# Patient Record
Sex: Female | Born: 1950 | Race: White | Hispanic: No | Smoking: Current some day smoker
Health system: Southern US, Community
[De-identification: ages and names within clinical notes are randomized; demographics above are authoritative.]

## PROBLEM LIST (undated history)

## (undated) DIAGNOSIS — F419 Anxiety disorder, unspecified: Secondary | ICD-10-CM

## (undated) DIAGNOSIS — F909 Attention-deficit hyperactivity disorder, unspecified type: Secondary | ICD-10-CM

## (undated) DIAGNOSIS — F32A Depression, unspecified: Secondary | ICD-10-CM

## (undated) DIAGNOSIS — E079 Disorder of thyroid, unspecified: Secondary | ICD-10-CM

## (undated) DIAGNOSIS — F329 Major depressive disorder, single episode, unspecified: Secondary | ICD-10-CM

## (undated) HISTORY — DX: Depression, unspecified: F32.A

## (undated) HISTORY — DX: Attention-deficit hyperactivity disorder, unspecified type: F90.9

## (undated) HISTORY — DX: Major depressive disorder, single episode, unspecified: F32.9

## (undated) HISTORY — DX: Anxiety disorder, unspecified: F41.9

## (undated) HISTORY — PX: THYROIDECTOMY: SHX17

## (undated) HISTORY — DX: Disorder of thyroid, unspecified: E07.9

## (undated) HISTORY — PX: ABDOMINAL HYSTERECTOMY: SHX81

## (undated) HISTORY — PX: COLON SURGERY: SHX602

---

## 2011-05-30 ENCOUNTER — Ambulatory Visit: Payer: Self-pay | Admitting: Internal Medicine

## 2012-05-30 ENCOUNTER — Ambulatory Visit: Payer: Self-pay | Admitting: Internal Medicine

## 2012-11-08 ENCOUNTER — Ambulatory Visit: Payer: Self-pay | Admitting: Unknown Physician Specialty

## 2012-11-20 ENCOUNTER — Emergency Department: Payer: Self-pay | Admitting: Emergency Medicine

## 2012-11-20 LAB — URINALYSIS, COMPLETE
Bilirubin,UR: NEGATIVE
Leukocyte Esterase: NEGATIVE
Nitrite: NEGATIVE
Ph: 7 (ref 4.5–8.0)
Protein: NEGATIVE
RBC,UR: <1 /HPF (ref 0–5)
Specific Gravity: 1.006 (ref 1.003–1.030)

## 2012-11-20 LAB — COMPREHENSIVE METABOLIC PANEL
Albumin: 4 g/dL (ref 3.4–5.0)
Alkaline Phosphatase: 145 U/L — ABNORMAL HIGH (ref 50–136)
Anion Gap: 6 — ABNORMAL LOW (ref 7–16)
Bilirubin,Total: 0.3 mg/dL (ref 0.2–1.0)
Co2: 30 mmol/L (ref 21–32)
Creatinine: 0.8 mg/dL (ref 0.60–1.30)
EGFR (African American): >60
Glucose: 100 mg/dL — ABNORMAL HIGH (ref 65–99)
Potassium: 3.6 mmol/L (ref 3.5–5.1)
Sodium: 137 mmol/L (ref 136–145)

## 2012-11-20 LAB — CBC
HCT: 39.2 % (ref 35.0–47.0)
MCH: 32.6 pg (ref 26.0–34.0)
MCV: 94 fL (ref 80–100)
RBC: 4.16 10*6/uL (ref 3.80–5.20)
RDW: 13.6 % (ref 11.5–14.5)

## 2012-11-20 LAB — TROPONIN I: Troponin-I: <0.02 ng/mL

## 2013-07-16 ENCOUNTER — Ambulatory Visit: Payer: Self-pay | Admitting: Internal Medicine

## 2014-07-21 ENCOUNTER — Ambulatory Visit: Payer: Self-pay | Admitting: Internal Medicine

## 2014-10-12 ENCOUNTER — Other Ambulatory Visit: Payer: Self-pay | Admitting: Unknown Physician Specialty

## 2014-10-12 DIAGNOSIS — M1711 Unilateral primary osteoarthritis, right knee: Secondary | ICD-10-CM

## 2014-10-15 ENCOUNTER — Ambulatory Visit
Admission: RE | Admit: 2014-10-15 | Discharge: 2014-10-15 | Disposition: A | Discharge status: Home / Self Care | Payer: PPO | Source: Ambulatory Visit | Attending: Unknown Physician Specialty | Admitting: Unknown Physician Specialty

## 2014-10-15 DIAGNOSIS — M948X6 Other specified disorders of cartilage, lower leg: Secondary | ICD-10-CM | POA: Insufficient documentation

## 2014-10-15 DIAGNOSIS — M1711 Unilateral primary osteoarthritis, right knee: Secondary | ICD-10-CM | POA: Insufficient documentation

## 2014-10-15 DIAGNOSIS — M94261 Chondromalacia, right knee: Secondary | ICD-10-CM | POA: Diagnosis not present

## 2014-10-15 DIAGNOSIS — S83241A Other tear of medial meniscus, current injury, right knee, initial encounter: Secondary | ICD-10-CM | POA: Diagnosis not present

## 2014-10-22 ENCOUNTER — Other Ambulatory Visit: Payer: Self-pay | Admitting: Unknown Physician Specialty

## 2014-10-22 DIAGNOSIS — M1711 Unilateral primary osteoarthritis, right knee: Secondary | ICD-10-CM

## 2014-10-24 ENCOUNTER — Other Ambulatory Visit: Payer: Self-pay | Admitting: Psychiatry

## 2014-10-29 ENCOUNTER — Ambulatory Visit: Payer: PPO

## 2014-11-02 ENCOUNTER — Ambulatory Visit: Payer: PPO | Admitting: Psychiatry

## 2014-11-03 ENCOUNTER — Other Ambulatory Visit: Payer: Self-pay | Admitting: Unknown Physician Specialty

## 2014-11-03 DIAGNOSIS — M1711 Unilateral primary osteoarthritis, right knee: Secondary | ICD-10-CM

## 2014-11-05 ENCOUNTER — Other Ambulatory Visit: Payer: Self-pay | Admitting: Psychiatry

## 2014-11-05 ENCOUNTER — Ambulatory Visit
Admission: RE | Admit: 2014-11-05 | Discharge: 2014-11-05 | Disposition: A | Discharge status: Home / Self Care | Payer: PPO | Source: Ambulatory Visit | Attending: Unknown Physician Specialty | Admitting: Unknown Physician Specialty

## 2014-11-05 DIAGNOSIS — M1711 Unilateral primary osteoarthritis, right knee: Secondary | ICD-10-CM

## 2014-12-10 ENCOUNTER — Ambulatory Visit: Payer: Self-pay | Admitting: Psychiatry

## 2014-12-24 ENCOUNTER — Ambulatory Visit (INDEPENDENT_AMBULATORY_CARE_PROVIDER_SITE_OTHER): Payer: PPO | Admitting: Psychiatry

## 2014-12-24 ENCOUNTER — Encounter: Payer: Self-pay | Admitting: Psychiatry

## 2014-12-24 VITALS — BP 110/78 | HR 73 | Temp 97.9°F | Ht 59.0 in | Wt 106.6 lb

## 2014-12-24 DIAGNOSIS — F331 Major depressive disorder, recurrent, moderate: Secondary | ICD-10-CM

## 2014-12-24 MED ORDER — TRAZODONE HCL 100 MG PO TABS: 100.0000 mg | ORAL_TABLET | Freq: Every day | ORAL | Status: DC

## 2014-12-24 MED ORDER — SERTRALINE HCL 100 MG PO TABS: 100.0000 mg | ORAL_TABLET | Freq: Every day | ORAL | Status: DC

## 2014-12-24 NOTE — Progress Notes (Signed)
BH MD/PA/NP OP Progress Note  12/24/2014 11:41 AM Brooke Li  MRN:  [id number]  Subjective:    Patient is a 64 year old female who presented for follow-up appointment. She was last seen in April. She reported that she has the partial knee replacement done 2 months ago as she was having severe pain in her knees. She is recuperating from the same. She reported that she has not been back to her work. She was concerned about her knee pain and reported that she feels anxious and depressed due to the same. Patient reported that she has been trying to exercise and to do self-help techniques to control her depression and anxiety. Patient reported that she is minimizing the use of pain medications. She was also having some issues with her stepsister. Patient reported that she is compliant with her medications and has been taking Zoloft and trazodone as prescribed. She reported that she has several medical bills related to her surgery.     Chief Complaint:  Chief Complaint    Follow-up; Anxiety; Depression; Stress     Visit Diagnosis:     ICD-9-CM ICD-10-CM   1. Major depressive disorder, recurrent episode, moderate 296.32 F33.1     Past Medical History:  Past Medical History  Diagnosis Date  . ADHD (attention deficit hyperactivity disorder)   . Anxiety   . Depression   . Thyroid disease     Past Surgical History  Procedure Laterality Date  . Thyroidectomy    . Abdominal hysterectomy    . Colon surgery     Family History:  Family History  Problem Relation Age of Onset  . Depression Mother   . Alcohol abuse Mother   . Heart attack Father   . COPD Sister   . Cancer Sister    Social History:  Social History   Social History  . Marital Status: Widowed    Spouse Name: N/A  . Number of Children: N/A  . Years of Education: N/A   Social History Main Topics  . Smoking status: Current Some Day Smoker -- 0.25 packs/day    Types: Cigarettes    Start date: 12/23/1969  . Smokeless  tobacco: Never Used  . Alcohol Use: No  . Drug Use: Yes    Special: Marijuana     Comment: last used 2-3 weeks ago  . Sexual Activity: No   Other Topics Concern  . None   Social History Narrative  . None   Additional History:   Patient currently lives by herself with her cat.  Assessment:   Musculoskeletal: Strength & Muscle Tone: within normal limits Gait & Station: just has partial knee replacement Patient leans: N/A  Psychiatric Specialty Exam: HPI  ROS  Blood pressure 110/78, pulse 73, temperature 97.9 F (36.6 C), temperature source Tympanic, height  (1.499 m), weight 106 lb 9.6 oz (48.353 kg), SpO2 95 %.Body mass index is 21.52 kg/(m^2).  General Appearance: Casual  Eye Contact:  Fair  Speech:  Clear and Coherent  Volume:  Normal  Mood:  Euphoric  Affect:  Congruent  Thought Process:  Coherent and Goal Directed  Orientation:  Full (Time, Place, and Person)  Thought Content:  WDL  Suicidal Thoughts:  No  Homicidal Thoughts:  No  Memory:  Immediate;   Fair  Judgement:  Fair  Insight:  Fair  Psychomotor Activity:  Normal  Concentration:  Fair  Recall:  [employer] of Knowledge: Fair  Language: Fair  Akathisia:  No  Handed:  Right  AIMS (if indicated):    Assets:  Communication Skills Desire for Improvement Physical Health  ADL's:  Intact  Cognition: WNL  Sleep:  6-7   Is the patient at risk to self?  No. Has the patient been a risk to self in the past 6 months?  No. Has the patient been a risk to self within the distant past?  Yes.   Is the patient a risk to others?  No. Has the patient been a risk to others in the past 6 months?  No. Has the patient been a risk to others within the distant past?  No.  Current Medications: Current Outpatient Prescriptions  Medication Sig Dispense Refill  . levothyroxine (SYNTHROID, LEVOTHROID) 88 MCG tablet TK 1 T PO ONCE D. TAKE OES WITH A GLASS OF WATER AT LEAST 30 TO 60 MIN B BRE  3  . pantoprazole  (PROTONIX) 20 MG tablet TK 1 T PO ONCE D  3  . sertraline (ZOLOFT) 100 MG tablet TAKE 1 TABLET BY MOUTH DAILY 30 tablet 0  . simvastatin (ZOCOR) 20 MG tablet TK 1 T PO ONCE D  3  . traZODone (DESYREL) 100 MG tablet Take 1 tablet (100 mg total) by mouth at bedtime. 30 tablet 0   No current facility-administered medications for this visit.    Medical Decision Making:  Established Problem, Stable/Improving (1), Review of Psycho-Social Stressors (1) and Review of Last Therapy Session (1)  Treatment Plan Summary:Medication management   She will continue on Zoloft and trazodone as prescribed. Patient was given a 90 day supply of her medications.  Follow-up in 3 months.   Brandy Hale 12/24/2014, 11:41 AM

## 2015-03-24 NOTE — Progress Notes (Signed)
As of 12-24-14 pt still taking both medications.  This was a reorder

## 2015-03-25 ENCOUNTER — Ambulatory Visit: Payer: PPO | Admitting: Psychiatry

## 2015-03-30 ENCOUNTER — Ambulatory Visit: Payer: PPO | Admitting: Psychiatry

## 2015-06-22 ENCOUNTER — Ambulatory Visit: Payer: PPO | Admitting: Psychiatry

## 2015-06-24 ENCOUNTER — Other Ambulatory Visit: Payer: Self-pay | Admitting: Psychiatry

## 2015-09-20 ENCOUNTER — Other Ambulatory Visit: Payer: Self-pay | Admitting: Psychiatry

## 2015-09-27 ENCOUNTER — Other Ambulatory Visit: Payer: Self-pay | Admitting: Psychiatry

## 2015-09-27 NOTE — Telephone Encounter (Signed)
Pt need to make an appointment

## 2015-09-29 ENCOUNTER — Encounter: Payer: Self-pay | Admitting: Psychiatry

## 2015-09-29 ENCOUNTER — Ambulatory Visit (INDEPENDENT_AMBULATORY_CARE_PROVIDER_SITE_OTHER): Payer: PPO | Admitting: Psychiatry

## 2015-09-29 VITALS — BP 117/69 | HR 85 | Temp 99.4°F | Ht 59.0 in | Wt 104.6 lb

## 2015-09-29 DIAGNOSIS — F331 Major depressive disorder, recurrent, moderate: Secondary | ICD-10-CM | POA: Diagnosis not present

## 2015-09-29 MED ORDER — SERTRALINE HCL 50 MG PO TABS: 125.0000 mg | ORAL_TABLET | Freq: Every day | ORAL | Status: DC

## 2015-09-29 MED ORDER — TRAZODONE HCL 100 MG PO TABS: 100.0000 mg | ORAL_TABLET | Freq: Every day | ORAL | Status: DC

## 2015-09-29 NOTE — Progress Notes (Signed)
BH MD/PA/NP OP Progress Note  09/29/2015 2:14 PM Brooke Li  MRN:  [id number]  Subjective:    Patient is a 65 year old female who presented for follow-up appointment. She was last seen in temper. Patient reported that she is trying to relocate to and that neighborhood as she is unable to afford her rent. Patient reported that her sister is in end-stage COPD. Patient reported that she has been titrating the dose of her Zoloft on her own and is currently taking 125 mg. She reported that she is also taking trazodone as prescribed. She was getting the medications on a 90 day supply. She reported that she has gained some weight. Her depression is improving. She currently denied having any perceptual disturbances. She reported that when she was seeing a psychiatrist in [place] they were increasing the dose of medications gradually. She continues to discuss her financial restraints during this interview. She appeared calm and cooperative during the interview and denied having any suicidal ideations or plans.     Chief Complaint:   Visit Diagnosis:     ICD-9-CM ICD-10-CM   1. Major depressive disorder, recurrent episode, moderate (HCC) 296.32 F33.1     Past Medical History:  Past Medical History  Diagnosis Date  . ADHD (attention deficit hyperactivity disorder)   . Anxiety   . Depression   . Thyroid disease     Past Surgical History  Procedure Laterality Date  . Thyroidectomy    . Abdominal hysterectomy    . Colon surgery     Family History:  Family History  Problem Relation Age of Onset  . Depression Mother   . Alcohol abuse Mother   . Heart attack Father   . COPD Sister   . Cancer Sister    Social History:  Social History   Social History  . Marital Status: Widowed    Spouse Name: N/A  . Number of Children: N/A  . Years of Education: N/A   Social History Main Topics  . Smoking status: Current Some Day Smoker -- 0.25 packs/day    Types: Cigarettes    Start date:  12/23/1969  . Smokeless tobacco: Never Used     Comment: Started after 8 months of not smoking in July  . Alcohol Use: No  . Drug Use: Yes    Special: Marijuana     Comment: last used 2-3 weeks ago  . Sexual Activity: No   Other Topics Concern  . None   Social History Narrative   Additional History:   Patient currently lives by herself with her cat.  Assessment:   Musculoskeletal: Strength & Muscle Tone: within normal limits Gait & Station: normal, fine Patient leans: N/A  Psychiatric Specialty Exam: HPI  ROS  Blood pressure 117/69, pulse 85, temperature 99.4 F (37.4 C), height 4\' 11"  (1.499 m), weight 104 lb 9.6 oz (47.446 kg), SpO2 96 %.Body mass index is 21.12 kg/(m^2).  General Appearance: Casual  Eye Contact:  Fair  Speech:  Clear and Coherent  Volume:  Normal  Mood:  Euphoric  Affect:  Congruent  Thought Process:  Coherent and Goal Directed  Orientation:  Full (Time, Place, and Person)  Thought Content:  WDL  Suicidal Thoughts:  No  Homicidal Thoughts:  No  Memory:  Immediate;   Fair  Judgement:  Fair  Insight:  Fair  Psychomotor Activity:  Normal  Concentration:  Fair  Recall:  [employer]  Fund of Knowledge: Fair  Language: Fair  Akathisia:  No  Handed:  Right  AIMS (if indicated):    Assets:  Communication Skills Desire for Improvement Physical Health  ADL's:  Intact  Cognition: WNL  Sleep:  6-7   Is the patient at risk to self?  No. Has the patient been a risk to self in the past 6 months?  No. Has the patient been a risk to self within the distant past?  Yes.   Is the patient a risk to others?  No. Has the patient been a risk to others in the past 6 months?  No. Has the patient been a risk to others within the distant past?  No.  Current Medications: Current Outpatient Prescriptions  Medication Sig Dispense Refill  . levothyroxine (SYNTHROID, LEVOTHROID) 88 MCG tablet TK 1 T PO ONCE D. TAKE OES WITH A GLASS OF WATER AT LEAST 30 TO 60 MIN B BRE   3  . pantoprazole (PROTONIX) 20 MG tablet TK 1 T PO ONCE D  3  . sertraline (ZOLOFT) 100 MG tablet Take 1 tablet (100 mg total) by mouth daily. 90 tablet 1  . simvastatin (ZOCOR) 20 MG tablet TK 1 T PO ONCE D  3  . traZODone (DESYREL) 100 MG tablet TAKE 1 TABLET(100 MG) BY MOUTH AT BEDTIME 90 tablet 0   No current facility-administered medications for this visit.    Medical Decision Making:  Established Problem, Stable/Improving (1), Review of Psycho-Social Stressors (1) and Review of Last Therapy Session (1)  Treatment Plan Summary:Medication management   Patient will be given Zoloft 125 mg in the morning She will be given trazodone 100 mg at bedtime  Discussed with her about side effects of the medications in detail   Follow-up in 2 months.   More than 50% of the time spent in psychoeducation, counseling and coordination of care.    This note was generated in part or whole with voice recognition software. Voice regonition is usually quite accurate but there are transcription errors that can and very often do occur. I apologize for any typographical errors that were not detected and corrected.   Brandy Hale, MD  09/29/2015, 2:14 PM

## 2015-11-23 ENCOUNTER — Other Ambulatory Visit: Payer: Self-pay | Admitting: Psychiatry

## 2015-11-23 MED ORDER — SERTRALINE HCL 50 MG PO TABS: 125.0000 mg | ORAL_TABLET | Freq: Every day | ORAL | 1 refills | Status: DC

## 2015-11-23 MED ORDER — TRAZODONE HCL 100 MG PO TABS: 100.0000 mg | ORAL_TABLET | Freq: Every day | ORAL | 1 refills | Status: DC

## 2015-11-29 ENCOUNTER — Ambulatory Visit (INDEPENDENT_AMBULATORY_CARE_PROVIDER_SITE_OTHER): Payer: PPO | Admitting: Psychiatry

## 2015-11-29 ENCOUNTER — Encounter: Payer: Self-pay | Admitting: Psychiatry

## 2015-11-29 VITALS — BP 142/64 | HR 76 | Temp 98.3°F | Ht 59.0 in | Wt 102.0 lb

## 2015-11-29 DIAGNOSIS — F331 Major depressive disorder, recurrent, moderate: Secondary | ICD-10-CM | POA: Diagnosis not present

## 2015-11-29 MED ORDER — TRAZODONE HCL 100 MG PO TABS: 100.0000 mg | ORAL_TABLET | Freq: Every day | ORAL | 1 refills | Status: AC

## 2015-11-29 MED ORDER — SERTRALINE HCL 50 MG PO TABS: 125.0000 mg | ORAL_TABLET | Freq: Every day | ORAL | 1 refills | Status: AC

## 2015-11-29 NOTE — Progress Notes (Signed)
BH MD/PA/NP OP Progress Note  11/29/2015 2:13 PM Radford PaxJanet L McGraw Zayas  MRN:  [id number]  Subjective:    Patient is a 65 year-old female who presented for follow-up appointment.  Patient reported that she is doing better since her medications were adjusted at the last appointment. She reported that she is able to sleep well at night. She is trying to adjust her house and has been painting by herself.  . Patient reported that she has been titrating the dose of her Zoloft on her own and is currently taking 125 mg. She reported that she is also taking trazodone as prescribed. She was getting the medications on a 90 day supply. She reported that she has gained some weight. Her depression is improving. She currently denied having any perceptual disturbances. She reported that when she was seeing a psychiatrist in [place] they were increasing the dose of medications gradually. She continues to discuss her financial restraints during this interview. She appeared calm and cooperative during the interview and denied having any suicidal ideations or plans.     Chief Complaint:  Chief Complaint    Follow-up; Medication Refill     Visit Diagnosis:   No diagnosis found.  Past Medical History:  Past Medical History:  Diagnosis Date  . ADHD (attention deficit hyperactivity disorder)   . Anxiety   . Depression   . Thyroid disease     Past Surgical History:  Procedure Laterality Date  . ABDOMINAL HYSTERECTOMY    . COLON SURGERY    . THYROIDECTOMY     Family History:  Family History  Problem Relation Age of Onset  . Depression Mother   . Alcohol abuse Mother   . Heart attack Father   . COPD Sister   . Cancer Sister    Social History:  Social History   Social History  . Marital status: Widowed    Spouse name: N/A  . Number of children: N/A  . Years of education: N/A   Social History Main Topics  . Smoking status: Current Some Day Smoker    Packs/day: 0.25    Types: Cigarettes   Start date: 12/23/1969  . Smokeless tobacco: Never Used     Comment: Started after 8 months of not smoking in July  . Alcohol use No  . Drug use:     Types: Marijuana     Comment: last used 2-3 weeks ago  . Sexual activity: No   Other Topics Concern  . None   Social History Narrative  . None   Additional History:   Patient currently lives by herself with her cat.  Assessment:   Musculoskeletal: Strength & Muscle Tone: within normal limits Gait & Station: normal, fine Patient leans: N/A  Psychiatric Specialty Exam: HPI  ROS  Blood pressure (!) 142/64, pulse 76, temperature 98.3 F (36.8 C), temperature source Oral, height 4\' 11"  (1.499 m), weight 102 lb (46.3 kg).Body mass index is 20.6 kg/m.  General Appearance: Casual  Eye Contact:  Fair  Speech:  Clear and Coherent  Volume:  Normal  Mood:  Euphoric  Affect:  Congruent  Thought Process:  Coherent and Goal Directed  Orientation:  Full (Time, Place, and Person)  Thought Content:  WDL  Suicidal Thoughts:  No  Homicidal Thoughts:  No  Memory:  Immediate;   Fair  Judgement:  Fair  Insight:  Fair  Psychomotor Activity:  Normal  Concentration:  Fair  Recall:  [employer]  Fund of Knowledge: Fair  Language: Fair  Akathisia:  No  Handed:  Right  AIMS (if indicated):    Assets:  Communication Skills Desire for Improvement Physical Health  ADL's:  Intact  Cognition: WNL  Sleep:  6-7   Is the patient at risk to self?  No. Has the patient been a risk to self in the past 6 months?  No. Has the patient been a risk to self within the distant past?  Yes.   Is the patient a risk to others?  No. Has the patient been a risk to others in the past 6 months?  No. Has the patient been a risk to others within the distant past?  No.  Current Medications: Current Outpatient Prescriptions  Medication Sig Dispense Refill  . levothyroxine (SYNTHROID, LEVOTHROID) 88 MCG tablet TK 1 T PO ONCE D. TAKE OES WITH A GLASS OF WATER AT LEAST  30 TO 60 MIN B BRE  3  . pantoprazole (PROTONIX) 20 MG tablet TK 1 T PO ONCE D  3  . sertraline (ZOLOFT) 50 MG tablet Take 2.5 tablets (125 mg total) by mouth daily. 75 tablet 1  . simvastatin (ZOCOR) 20 MG tablet TK 1 T PO ONCE D  3  . traZODone (DESYREL) 100 MG tablet Take 1 tablet (100 mg total) by mouth at bedtime. 30 tablet 1  . aspirin 81 MG chewable tablet Chew by mouth.    Marland Kitchen CALCIUM CITRATE-VITAMIN D PO Take by mouth.    . Cholecalciferol (VITAMIN D-1000 MAX ST) 1000 units tablet Take by mouth.    . Multiple Vitamin (MULTI-VITAMINS) TABS Take by mouth.    . vitamin E 100 UNIT capsule Take by mouth.     No current facility-administered medications for this visit.     Medical Decision Making:  Established Problem, Stable/Improving (1), Review of Psycho-Social Stressors (1) and Review of Last Therapy Session (1)  Treatment Plan Summary:Medication management   Patient will be given Zoloft 125 mg in the morning She will be given trazodone 100 mg at bedtime  Discussed with her about side effects of the medications in detail   Follow-up in 2 months.   More than 50% of the time spent in psychoeducation, counseling and coordination of care.    This note was generated in part or whole with voice recognition software. Voice regonition is usually quite accurate but there are transcription errors that can and very often do occur. I apologize for any typographical errors that were not detected and corrected.   Brandy Hale, MD  11/29/2015, 2:13 PM

## 2016-01-27 ENCOUNTER — Ambulatory Visit: Payer: PPO | Admitting: Psychiatry

## 2016-10-24 ENCOUNTER — Telehealth: Payer: Self-pay

## 2016-10-24 NOTE — Telephone Encounter (Signed)
pt called states she needs a refill or at least enough to get to her appt. pt state she needs sertraline .    Disp Refills Start End   sertraline (ZOLOFT) 50 MG tablet 75 tablet 1 11/29/2015    Sig - Route: Take 2.5 tablets (125 mg total) by mouth daily. - Oral   E-Prescribing Status: Receipt confirmed by pharmacy (11/29/2015 2:19 PM EDT)

## 2016-10-24 NOTE — Telephone Encounter (Signed)
Please check her last appointment and what dosage she was taking and how many refills were given.

## 2016-10-27 NOTE — Telephone Encounter (Signed)
  Pt called states that she needs refill on her sertraline. Pt was last seen on 11-29-15 next appt 11-20-16. Pt was advised to go to ER. Because she has not been seen in the last 3 month.  I ask pt is she takes medication everyday.  She stated "yes"  I asked her who had been prescribing the sertraline because last time it was written by Dr. Garnetta BuddyFaheem it was on 11-29-15 with one refill and she should have run out in nov 7th.  Pt insisted dr. Garnetta Buddyfaheem gives her a 90 day supply.  I told pt that it was not a 90 day supply. Asked her who was witting the rx. Pt still insist it was dr. Garnetta BuddyFaheem. Pt was advised to go to ER or call PCP and see if they would refill because she not been seen here for almost a year.   Disp Refills Start End   sertraline (ZOLOFT) 50 MG tablet 75 tablet 1 11/29/2015    Sig - Route: Take 2.5 tablets (125 mg total) by mouth daily. - Oral   E-Prescribing Status: Receipt confirmed by pharmacy (11/29/2015 2:19 PM EDT)

## 2016-10-27 NOTE — Telephone Encounter (Signed)
called to find out when the last time the sertraline was refilled.  per the pharamcist it was 04-03-2016.

## 2016-11-20 ENCOUNTER — Ambulatory Visit: Payer: PPO | Admitting: Psychiatry

## 2016-12-20 NOTE — Telephone Encounter (Signed)
What the status

## 2017-01-29 NOTE — Telephone Encounter (Signed)
sertraline (ZOLOFT) 50 MG tablet  Medication  Date: 11/29/2015 Department: [facility] Ordering/Authorizing: Brandy Hale, MD  Order Providers   Prescribing Provider Encounter Provider  Brandy Hale, MD Brandy Hale, MD  Medication Detail    Disp Refills Start End   sertraline (ZOLOFT) 50 MG tablet 75 tablet 1 11/29/2015    Sig - Route: Take 2.5 tablets (125 mg total) by mouth daily. - Oral   Sent to pharmacy as: sertraline 50 MG PO tablet   E-Prescribing Status: Receipt confirmed by pharmacy (11/29/2015 2:19 PM EDT

## 2017-06-26 ENCOUNTER — Other Ambulatory Visit: Payer: Self-pay

## 2017-06-26 ENCOUNTER — Emergency Department
Admission: EM | Admit: 2017-06-26 | Discharge: 2017-06-26 | Disposition: A | Discharge status: Home / Self Care | Payer: Medicare HMO | Attending: Emergency Medicine | Admitting: Emergency Medicine

## 2017-06-26 ENCOUNTER — Emergency Department: Payer: Medicare HMO

## 2017-06-26 ENCOUNTER — Encounter: Payer: Self-pay | Admitting: Emergency Medicine

## 2017-06-26 DIAGNOSIS — Z91040 Latex allergy status: Secondary | ICD-10-CM | POA: Diagnosis not present

## 2017-06-26 DIAGNOSIS — E079 Disorder of thyroid, unspecified: Secondary | ICD-10-CM | POA: Diagnosis not present

## 2017-06-26 DIAGNOSIS — F17210 Nicotine dependence, cigarettes, uncomplicated: Secondary | ICD-10-CM | POA: Insufficient documentation

## 2017-06-26 DIAGNOSIS — Z79899 Other long term (current) drug therapy: Secondary | ICD-10-CM | POA: Insufficient documentation

## 2017-06-26 DIAGNOSIS — F410 Panic disorder [episodic paroxysmal anxiety] without agoraphobia: Secondary | ICD-10-CM | POA: Insufficient documentation

## 2017-06-26 DIAGNOSIS — F419 Anxiety disorder, unspecified: Secondary | ICD-10-CM | POA: Insufficient documentation

## 2017-06-26 DIAGNOSIS — R079 Chest pain, unspecified: Secondary | ICD-10-CM | POA: Diagnosis present

## 2017-06-26 DIAGNOSIS — F1210 Cannabis abuse, uncomplicated: Secondary | ICD-10-CM | POA: Diagnosis not present

## 2017-06-26 LAB — BASIC METABOLIC PANEL
Anion gap: 12 (ref 5–15)
BUN: 11 mg/dL (ref 6–20)
CHLORIDE: 104 mmol/L (ref 101–111)
CO2: 23 mmol/L (ref 22–32)
CREATININE: 0.88 mg/dL (ref 0.44–1.00)
Calcium: 9.6 mg/dL (ref 8.9–10.3)
GFR calc Af Amer: >60 mL/min (ref >60)
GFR calc non Af Amer: >60 mL/min (ref >60)
Glucose, Bld: 118 mg/dL — ABNORMAL HIGH (ref 65–99)
Potassium: 4.1 mmol/L (ref 3.5–5.1)
SODIUM: 139 mmol/L (ref 135–145)

## 2017-06-26 LAB — TROPONIN I: Troponin I: <0.03 ng/mL (ref <0.03)

## 2017-06-26 LAB — CBC
HCT: 38.3 % (ref 35.0–47.0)
Hemoglobin: 13.1 g/dL (ref 12.0–16.0)
MCH: 32 pg (ref 26.0–34.0)
MCHC: 34.3 g/dL (ref 32.0–36.0)
MCV: 93.3 fL (ref 80.0–100.0)
PLATELETS: 313 10*3/uL (ref 150–440)
RBC: 4.1 MIL/uL (ref 3.80–5.20)
RDW: 13.3 % (ref 11.5–14.5)
WBC: 10.6 10*3/uL (ref 3.6–11.0)

## 2017-06-26 NOTE — Discharge Instructions (Signed)
please follow-up with your regular doctor. Please return for any further problems.especially return if you develop chest pain with exertion and not tympanic.

## 2017-06-26 NOTE — ED Notes (Signed)
Pt taken to xray 

## 2017-06-26 NOTE — ED Triage Notes (Signed)
Pt to ED via EMS from home with c/o anxiety that caused CP this am. Pt states she is stressed about paying rent. PT tearful during triage. Pt describes chest  pain as pressure. Per EMS pt has psych meds that she is not currently taking because of allergy. Pt A&Ox4, VS stable

## 2017-06-26 NOTE — ED Notes (Signed)
Pt refuses BP cuff to be on at discharge for d/c VS . PT ambulatory, verbalizes d/c understanding and follow up. Family at bedside

## 2017-06-26 NOTE — ED Provider Notes (Signed)
[facility] Emergency Department Provider Note   ____________________________________________   First MD Initiated Contact with Patient 06/26/17 1238     (approximate)  I have reviewed the triage vital signs and the nursing notes.   HISTORY  Chief Complaint Anxiety   HPI Brooke Li is a 67 y.o. Female who reports she had a panic attack this morning causing some chest pain. She says she was very anxious about paying the rent. She says her  her dog columns her down a lot. She is feeling fine now she started feeling fine when she got here and the pain went away since she relaxes. She says she has this frequently. She wants to go home. She does not want me to check the second troponin as I usually do. Her EKG chest x-ray and first troponin were all normal. Symptoms had started over and hour ago. They were not severe. Nothing seemed to make them better or worse except for getting over the anxiety.   Past Medical History:  Diagnosis Date  . ADHD (attention deficit hyperactivity disorder)   . Anxiety   . Depression   . Thyroid disease     There are no active problems to display for this patient.   Past Surgical History:  Procedure Laterality Date  . ABDOMINAL HYSTERECTOMY    . COLON SURGERY    . THYROIDECTOMY      Prior to Admission medications   Medication Sig Start Date End Date Taking? Authorizing Provider  calcium carbonate (OSCAL) 1500 (600 Ca) MG TABS tablet Take by mouth 2 (two) times daily with a meal.   Yes [provider]  Cholecalciferol (VITAMIN D-1000 MAX ST) 1000 units tablet Take 1,000 Units by mouth daily.    Yes [provider]  ibuprofen (ADVIL,MOTRIN) 200 MG tablet Take 200 mg by mouth every 6 (six) hours as needed.   Yes [provider]  levothyroxine (SYNTHROID, LEVOTHROID) 88 MCG tablet Take 1 tablet by mouth daily. 06/25/17  Yes [provider]  Multiple Vitamin (MULTI-VITAMINS) TABS  Take 1 tablet by mouth daily.    Yes [provider]  pantoprazole (PROTONIX) 20 MG tablet Take 1 tablet by mouth daily 10/22/14  Yes [provider]  simvastatin (ZOCOR) 20 MG tablet take 1 tablet by mouth daily 10/22/14  Yes [provider]  sertraline (ZOLOFT) 50 MG tablet Take 2.5 tablets (125 mg total) by mouth daily. Patient not taking: Reported on 06/26/2017 11/29/15   Brandy HaleFaheem, Uzma, MD  traZODone (DESYREL) 100 MG tablet Take 1 tablet (100 mg total) by mouth at bedtime. Patient not taking: Reported on 06/26/2017 11/29/15   Brandy HaleFaheem, Uzma, MD    Allergies Bisphosphonates; Celecoxib; Codeine; Codone [hydrocodone]; Hydrochlorothiazide; Latex; Lucentis [ranibizumab]; Phenytoin; Sulfur; Tapentadol; and Sulfa antibiotics  Family History  Problem Relation Age of Onset  . Depression Mother   . Alcohol abuse Mother   . Heart attack Father   . COPD Sister   . Cancer Sister     Social History Social History   Tobacco Use  . Smoking status: Current Some Day Smoker    Packs/day: 0.25    Types: Cigarettes    Start date: 12/23/1969  . Smokeless tobacco: Never Used  . Tobacco comment: Started after 8 months of not smoking in July  Substance Use Topics  . Alcohol use: No    Alcohol/week: 0.0 oz  . Drug use: Yes    Types: Marijuana    Comment: last used 2-3  weeks ago    Review of Systems  Constitutional: No fever/chills Eyes: No visual changes. ENT: No sore throat. Cardiovascular: see history of present illness Respiratory: Denies shortness of breath. Gastrointestinal: No abdominal pain.  No nausea, no vomiting.  No diarrhea.  No constipation. Genitourinary: Negative for dysuria. Musculoskeletal: Negative for back pain. Skin: Negative for rash. Neurological: Negative for headaches, focal weakness   ____________________________________________   PHYSICAL EXAM:  VITAL SIGNS: ED Triage Vitals  Enc Vitals Group     BP 06/26/17 1157 119/64     Pulse Rate  06/26/17 1155 93     Resp 06/26/17 1155 20     Temp 06/26/17 1158 98.2 F (36.8 C)     Temp Source 06/26/17 1155 Oral     SpO2 06/26/17 1155 99 %     Weight --      Height --      Head Circumference --      Peak Flow --      Pain Score 06/26/17 1155 0     Pain Loc --      Pain Edu? --      Excl. in GC? --     Constitutional: Alert and oriented. Well appearing and in no acute distress. Eyes: Conjunctivae are normal. Head: Atraumatic. Nose: No congestion/rhinnorhea. Mouth/Throat: Mucous membranes are moist.  Oropharynx non-erythematous. Neck: No stridor.   Cardiovascular: Normal rate, regular rhythm. Grossly normal heart sounds.  Good peripheral circulation. Respiratory: Normal respiratory effort.  No retractions. Lungs CTAB. Gastrointestinal: Soft and nontender. No distention. No abdominal bruits. No CVA tenderness. Musculoskeletal: No lower extremity tenderness nor edema.  No joint effusions. Neurologic:  Normal speech and language. No gross focal neurologic deficits are appreciated. No gait instability. Skin:  Skin is warm, dry and intact. No rash noted. Psychiatric: Mood and affect are normal. Speech and behavior are normal.  ____________________________________________   LABS (all labs ordered are listed, but only abnormal results are displayed)  Labs Reviewed  BASIC METABOLIC PANEL - Abnormal; Notable for the following components:      Result Value   Glucose, Bld 118 (*)    All other components within normal limits  CBC  TROPONIN I   ____________________________________________  EKG  EKG read and interpreted by me shows normal sinus rhythm rate of 92 normal axis essentially normal EKG slight shortening of PR interval ____________________________________________  RADIOLOGY  ED MD interpretationchest x-ray reviewed by me patient's lungs are clear patient has no fever no cough of believe change in her chest x-rays actually because the one was more penetrated than  the other.  Official radiology report(s): Dg Chest 2 View  Result Date: 06/26/2017 CLINICAL DATA:  Chest pain. EXAM: CHEST  2 VIEW COMPARISON:  11/20/2012 FINDINGS: Heart size and pulmonary vascularity are normal. Aortic atherosclerosis. No consolidative infiltrates or effusions. Slight accentuation of the interstitial markings at the lung bases. No appreciable acute bone abnormality. IMPRESSION: 1. Slight accentuation of the interstitial markings at the lung bases, new since the prior study. I suspect this represents mild interstitial lung disease. 2.  Aortic Atherosclerosis (ICD10-I70.0). Electronically Signed   By: Francene Boyers M.D.   On: 06/26/2017 12:28    ____________________________________________   PROCEDURES  Procedure(s) performed:   Procedures  Critical Care performed:   ____________________________________________   INITIAL IMPRESSION / ASSESSMENT AND PLAN / ED COURSE  has noted in history of present illness patient does not want to stay any longer wants to go home. She says she's had this  before same all the time and panic attack I will let her go. She can follow up with her doctor.  Clinical Course as of Jun 27 1250  Tue Jun 26, 2017  1238 WBC: 10.6 [PM]    Clinical Course User Index [PM] Arnaldo Natal, MD     ____________________________________________   FINAL CLINICAL IMPRESSION(S) / ED DIAGNOSES  Final diagnoses:  Anxiety attack     ED Discharge Orders    None       Note:  This document was prepared using Dragon voice recognition software and may include unintentional dictation errors.    Arnaldo Natal, MD 06/26/17 1252

## 2018-03-20 IMAGING — CR DG CHEST 2V
2 series · 2 of 2 positions shown · non-contrast
Comparison: 11/20/2012

CLINICAL DATA: Chest pain.

EXAM:
CHEST  2 VIEW

[chest pa]
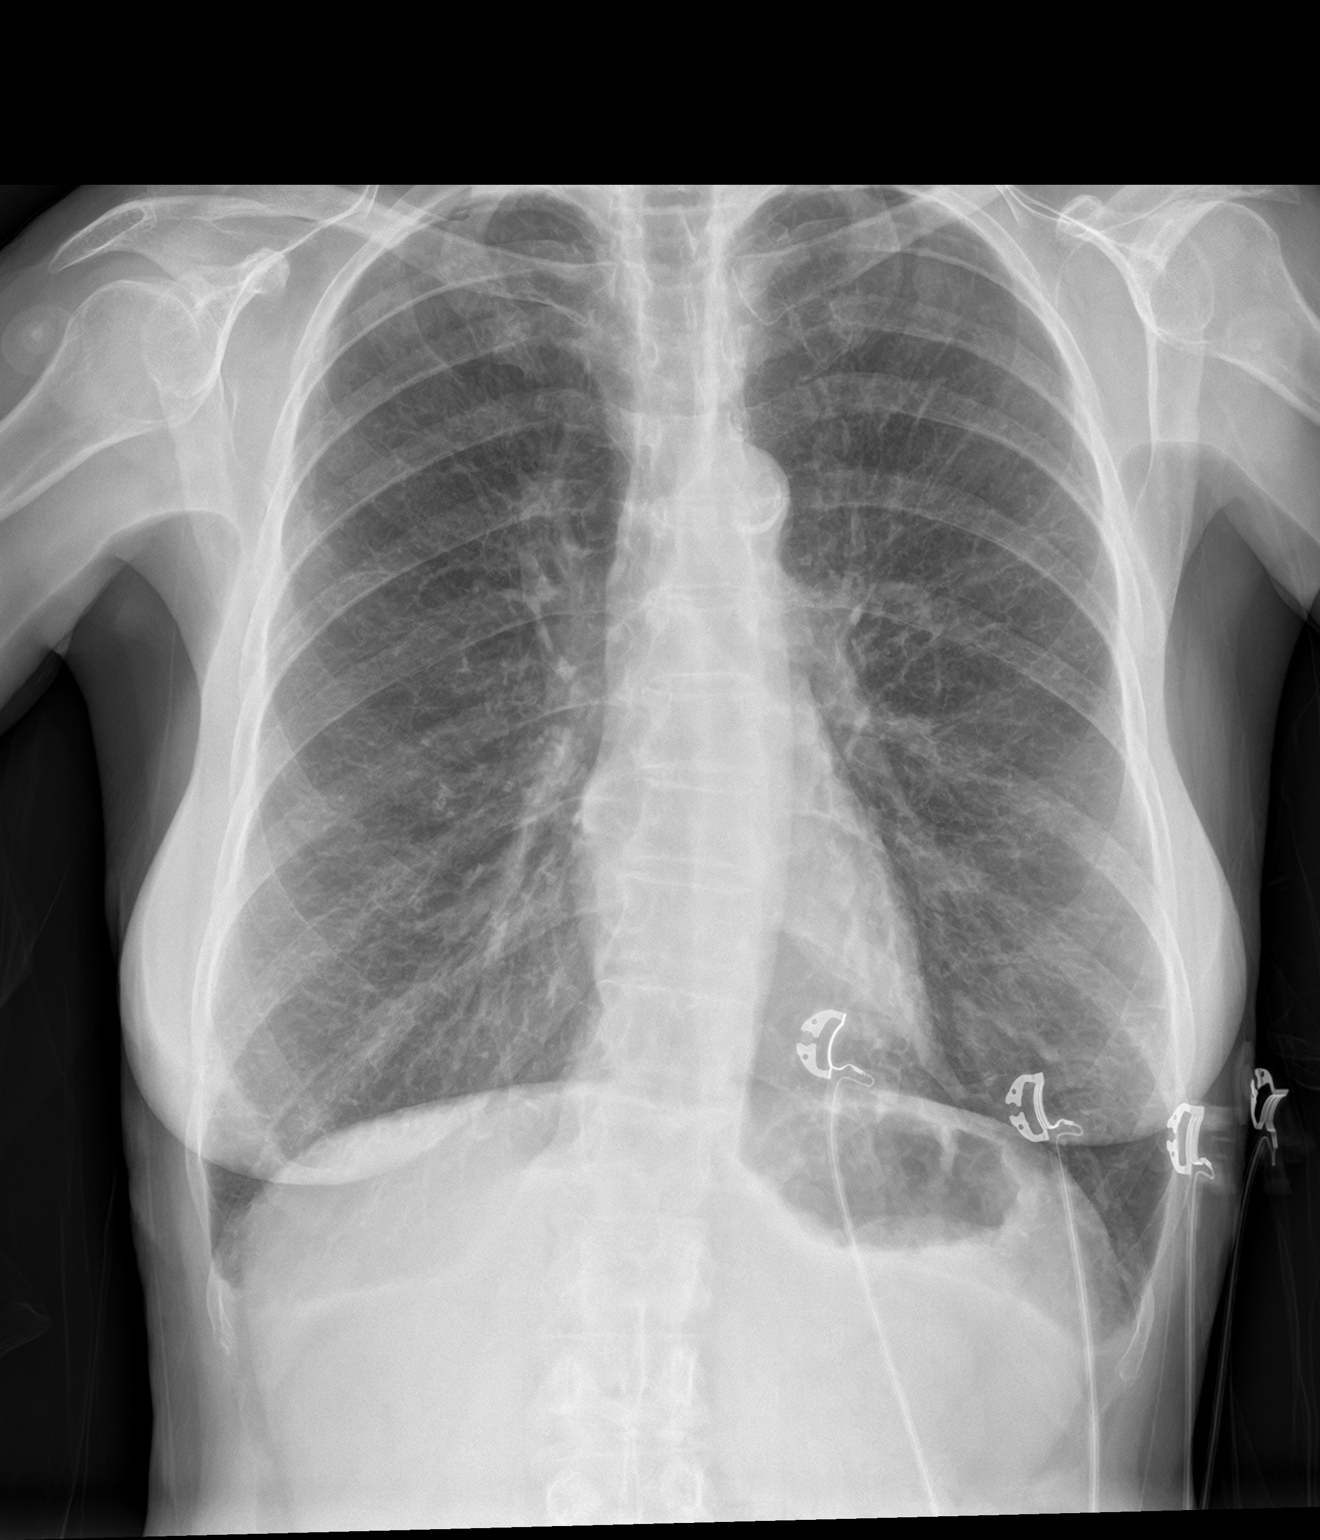

[chest lat]
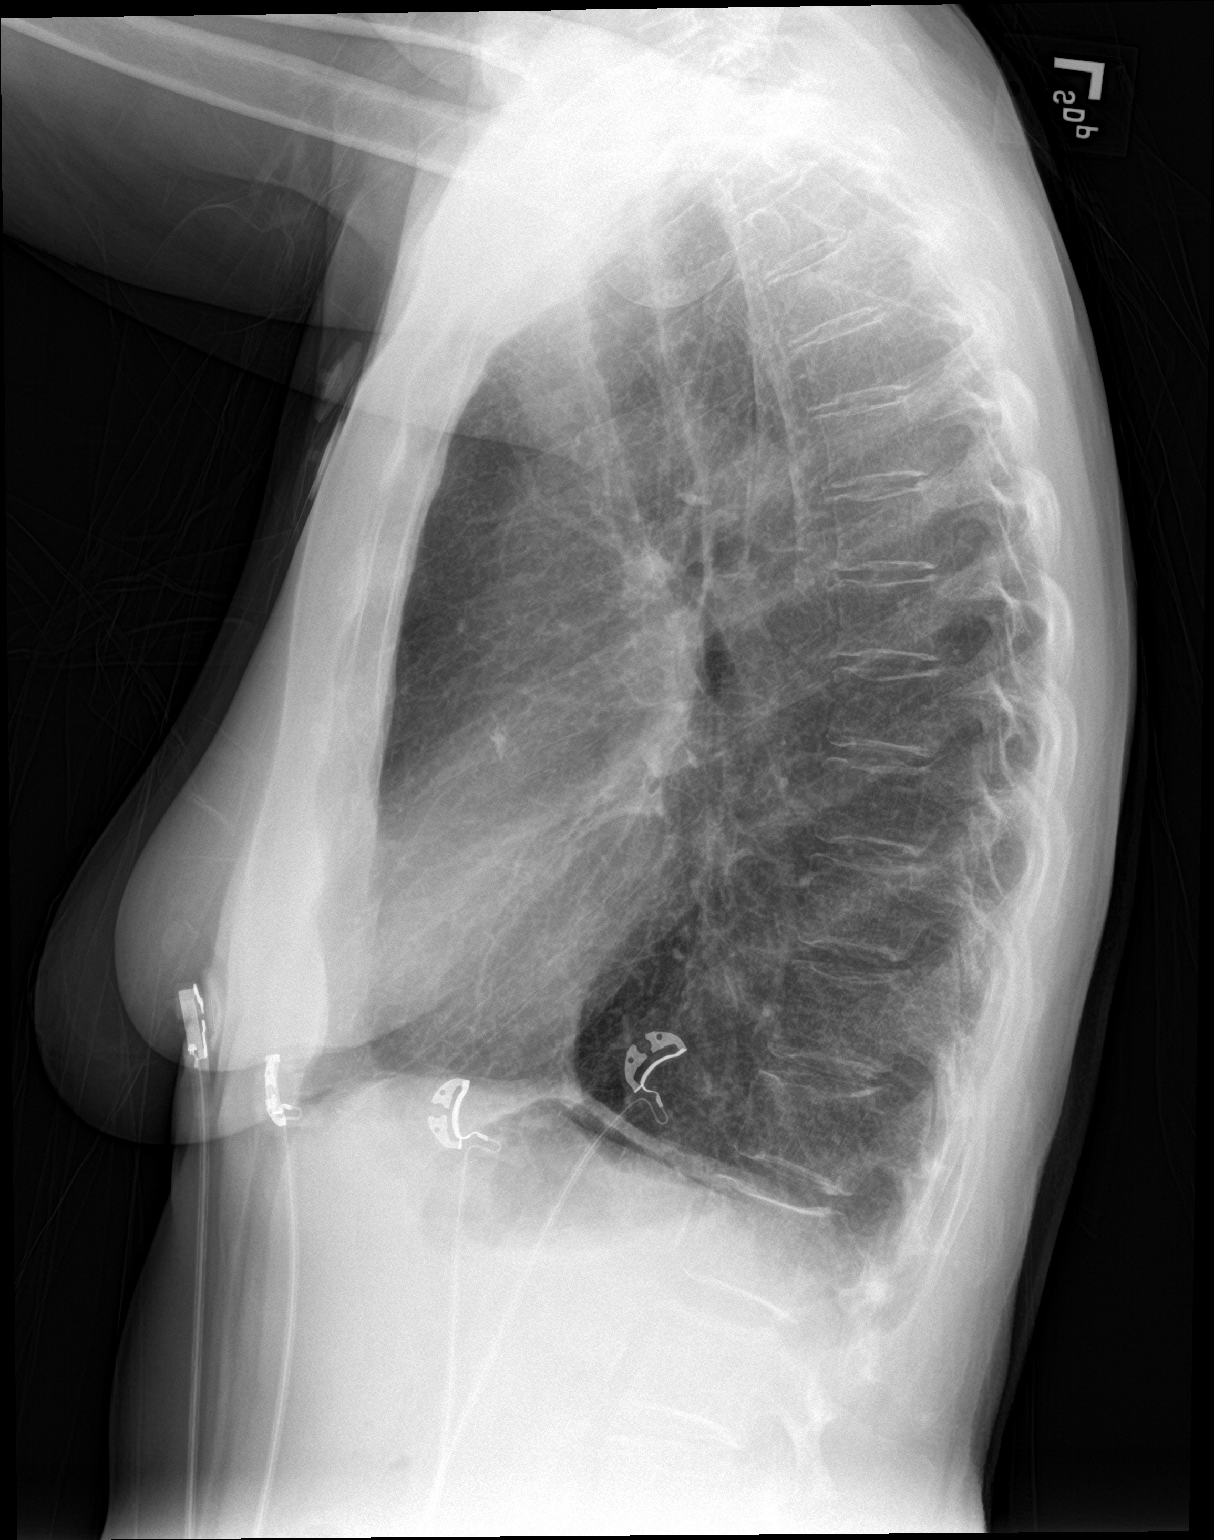

[2 of 2 positions shown; findings below may reference images not displayed]

FINDINGS: Heart size and pulmonary vascularity are normal. Aortic
atherosclerosis. No consolidative infiltrates or effusions. Slight
accentuation of the interstitial markings at the lung bases. No
appreciable acute bone abnormality.
IMPRESSION: 1. Slight accentuation of the interstitial markings at the lung
bases, new since the prior study. I suspect this represents mild
interstitial lung disease.
2.  Aortic Atherosclerosis ([id number]).
